# Patient Record
Sex: Male | Born: 1970 | Race: White | Hispanic: No | Marital: Married | Smoking: Never smoker
Health system: Southern US, Community
[De-identification: ages and names within clinical notes are randomized; demographics above are authoritative.]

---

## 2016-12-30 ENCOUNTER — Emergency Department (HOSPITAL_BASED_OUTPATIENT_CLINIC_OR_DEPARTMENT_OTHER): Payer: Managed Care, Other (non HMO)

## 2016-12-30 ENCOUNTER — Emergency Department (HOSPITAL_BASED_OUTPATIENT_CLINIC_OR_DEPARTMENT_OTHER)
Admission: EM | Admit: 2016-12-30 | Discharge: 2016-12-30 | Disposition: A | Discharge status: Home / Self Care | Payer: Managed Care, Other (non HMO) | Attending: Emergency Medicine | Admitting: Emergency Medicine

## 2016-12-30 ENCOUNTER — Encounter (HOSPITAL_BASED_OUTPATIENT_CLINIC_OR_DEPARTMENT_OTHER): Payer: Self-pay | Admitting: *Deleted

## 2016-12-30 DIAGNOSIS — S92531A Displaced fracture of distal phalanx of right lesser toe(s), initial encounter for closed fracture: Secondary | ICD-10-CM | POA: Diagnosis not present

## 2016-12-30 DIAGNOSIS — Y929 Unspecified place or not applicable: Secondary | ICD-10-CM | POA: Diagnosis not present

## 2016-12-30 DIAGNOSIS — Z23 Encounter for immunization: Secondary | ICD-10-CM | POA: Diagnosis not present

## 2016-12-30 DIAGNOSIS — Y9389 Activity, other specified: Secondary | ICD-10-CM | POA: Insufficient documentation

## 2016-12-30 DIAGNOSIS — W228XXA Striking against or struck by other objects, initial encounter: Secondary | ICD-10-CM | POA: Diagnosis not present

## 2016-12-30 DIAGNOSIS — S99921A Unspecified injury of right foot, initial encounter: Secondary | ICD-10-CM | POA: Diagnosis present

## 2016-12-30 DIAGNOSIS — Y999 Unspecified external cause status: Secondary | ICD-10-CM | POA: Diagnosis not present

## 2016-12-30 MED ORDER — CEPHALEXIN 500 MG PO CAPS: 500.0000 mg | ORAL_CAPSULE | Freq: Two times a day (BID) | ORAL | 0 refills | Status: AC

## 2016-12-30 MED ORDER — TETANUS-DIPHTH-ACELL PERTUSSIS 5-2.5-18.5 LF-MCG/0.5 IM SUSP
0.5000 mL | Freq: Once | INTRAMUSCULAR | Status: AC
  Administered 2016-12-30: 0.5 mL via INTRAMUSCULAR
  Filled 2016-12-30: qty 0.5

## 2016-12-30 MED ORDER — LIDOCAINE HCL (PF) 1 % IJ SOLN
5.0000 mL | Freq: Once | INTRAMUSCULAR | Status: AC
  Administered 2016-12-30: 5 mL
  Filled 2016-12-30: qty 5

## 2016-12-30 MED ORDER — CEPHALEXIN 250 MG PO CAPS
500.0000 mg | ORAL_CAPSULE | Freq: Once | ORAL | Status: AC
  Administered 2016-12-30: 500 mg via ORAL
  Filled 2016-12-30: qty 2

## 2016-12-30 NOTE — Discharge Instructions (Signed)
Read the information below.  You have a fracture of your 4th toe. Your toes are being buddy taped and you are being placed in a post-op shoe. You can take tylenol/motrin for pain relief.  You had an injury to the nail as well. The wound was irrigated, antibiotic ointment applied, and a dressing applied. Your tetanus was updated. After 24 hours you can removed bandage and wash with warm soap and water. Reapply antibiotic ointment and dressing. You are being prescribed antibiotics to prevent infection.  Ice and elevate for 20 minute increments.  It is very important that you follow up with orthopedics. I have provided the contact information above. Please call in the morning.  Use the prescribed medication as directed.  Please discuss all new medications with your pharmacist.   You may return to the Emergency Department at any time for worsening condition or any new symptoms that concern you. Return if signs of infection - redness, swelling, warmth, purulent drainage, fever.

## 2016-12-30 NOTE — ED Triage Notes (Signed)
Pt c/o right 4th toe injury x 1 hr ago

## 2016-12-30 NOTE — ED Notes (Signed)
ED Provider at bedside. 

## 2016-12-30 NOTE — ED Provider Notes (Signed)
[facility]-EMERGENCY DEPT Provider Note   CSN: [id number] Arrival date & time: 12/30/16  1614  By signing my name below, I, Linna DarnerRussell Turner, attest that this documentation has been prepared under the direction and in the presence of Arvilla MeresAshley Milessa Hogan, PA-C. Electronically Signed: Linna Darnerussell Turner, Scribe. 12/30/2016. 5:25 PM.  History   Chief Complaint Chief Complaint  Patient presents with  . Toe Injury    The history is provided by the patient. No language interpreter was used.    HPI Comments: Brian Dennis is a 46 y.o. male who presents to the Emergency Department complaining of a right toe injury that occurred a couple of hours ago. He states he was chopping firewood and not wearing shoes; he states a piece of wood struck and lacerated the 4th digit of his right foot. He notes pain to and bleeding from this toe. No medications or treatments tried PTA. He states he is not UTD for tetanus. No anticoagulants. No h/o immunocompromising conditions or chronic medical conditions. He denies numbness/tingling, neuro deficits, weakness, or any other associated symptoms.  History reviewed. No pertinent past medical history.  Patient Active Problem List   Diagnosis Date Noted  . Injury of right toe, initial encounter 01/01/2017    History reviewed. No pertinent surgical history.     Home Medications    Prior to Admission medications   Medication Sig Start Date End Date Taking? Authorizing Provider  cephALEXin (KEFLEX) 500 MG capsule Take 1 capsule (500 mg total) by mouth 2 (two) times daily. 12/30/16 01/04/17  Lona KettleAshley Laurel Lj Miyamoto, PA-C    Family History History reviewed. No pertinent family history.  Social History Social History  Substance Use Topics  . Smoking status: Never Smoker  . Smokeless tobacco: Never Used  . Alcohol use No     Allergies   Patient has no known allergies.   Review of Systems Review of Systems  Constitutional: Negative for fever.  Musculoskeletal: Positive  for arthralgias (4th digit of right foot secondary to wound).  Skin: Positive for wound (laceration to 4th digit of right foot).  Allergic/Immunologic: Negative for immunocompromised state.  Neurological: Negative for weakness and numbness.     Physical Exam Updated Vital Signs BP 139/93   Pulse 99   Temp 98.2 F (36.8 C)   Resp 20   Ht 5\' 11"  (1.803 m)   Wt 86.2 kg   SpO2 99%   BMI 26.50 kg/m   Physical Exam  Constitutional: He appears well-developed and well-nourished. No distress.  HENT:  Head: Normocephalic and atraumatic.  Eyes: Conjunctivae are normal. No scleral icterus.  Neck: Normal range of motion.  Pulmonary/Chest: Effort normal. No respiratory distress.  Musculoskeletal:  Right foot: swelling and tenderness to 4th digit. Loss of eponychium noted. Displaced nail at proximal end. Bruising noted under proximal end of nail. Superficial laceration. Capillary refill intact. Sensation intact. Able to move toes.   Neurological: He is alert. He is not disoriented. No sensory deficit. GCS eye subscore is 4. GCS verbal subscore is 5. GCS motor subscore is 6.  Skin: Skin is warm and dry. He is not diaphoretic.  Psychiatric: He has a normal mood and affect. His behavior is normal.     ED Treatments / Results  Labs (all labs ordered are listed, but only abnormal results are displayed) Labs Reviewed - No data to display  EKG  EKG Interpretation None       Radiology No results found.  Procedures Procedures (including critical care time) NERVE BLOCK Performed  by: Lona Kettle Consent: Verbal consent obtained. Required items: required blood products, implants, devices, and special equipment available  Indication: procedure Nerve block body site: right foot 4th digit  Preparation: Patient was prepped and draped in the usual sterile fashion. Needle gauge: 24 G Location technique: anatomical landmarks  Local anesthetic: 1% lidocaine w/o  epinephrine  Anesthetic total: 4 ml  Outcome: pain improved Patient tolerance: Patient tolerated the procedure well with no immediate complications.  DIAGNOSTIC STUDIES: Oxygen Saturation is 99% on RA, normal by my interpretation.    COORDINATION OF CARE: 5:31 PM Discussed treatment plan with pt at bedside and pt agreed to plan.  Medications Ordered in ED Medications  lidocaine (PF) (XYLOCAINE) 1 % injection 5 mL (5 mLs Infiltration Given 12/30/16 1744)  Tdap (BOOSTRIX) injection 0.5 mL (0.5 mLs Intramuscular Given 12/30/16 1744)  cephALEXin (KEFLEX) capsule 500 mg (500 mg Oral Given 12/30/16 2010)     Initial Impression / Assessment and Plan / ED Course  I have reviewed the triage vital signs and the nursing notes.  Pertinent labs & imaging results that were available during my care of the patient were reviewed by me and considered in my medical decision making (see chart for details).  Clinical Course as of Jan 02 741  Mon Dec 30, 2016  1749 DG Foot Complete Right [AM]    Clinical Course User Index [AM] Lona Kettle, PA-C    Patient presents to ED with complaint of right 4th digit pain s/p injury PTA. Pt dropped piece of wood on bare foot. No anti-coagulation therapy. No immunocompromising conditions. Patient is afebrile and non-toxic appearing in NAD. VSS. Swelling and tenderness to right 4th digit noted; eponychium absent, proximal nail displaced, bruising noted under nail - likely nailbed injury. Neurovascularly intact. X-ray remarkable for comminuted fracture of 4th distal phalanx. Tetanus updated. Digital block performed by me. Wound copiously irrigated and explored by me. Unable to suture nail to eponychium given loss of eponychium. Lacerations are superficial and not amenable to closure. Discussed pt with Dr. Verdie Mosher, who also evaluated patient.   Discussed results and plan with pt. Dr. Verdie Mosher discussed with pt probable loss of nail. Will not remove nail so as to provide  protection to underlying nailbed. ABX ointment and bandage applied. Toes buddy taped and post-op shoe applied. Given probable injury to nail bed will initiate ABX. Encouraged follow up with orthopedics for further evaluation and management. Symptomatic management discussed. Referral to ortho provided. Return precautions given. Pt voiced understanding and is agreeable.   Final Clinical Impressions(s) / ED Diagnoses   Final diagnoses:  Displaced fracture of distal phalanx of right lesser toe(s), initial encounter for closed fracture    New Prescriptions Discharge Medication List as of 12/30/2016  8:15 PM    START taking these medications   Details  cephALEXin (KEFLEX) 500 MG capsule Take 1 capsule (500 mg total) by mouth 2 (two) times daily., Starting Mon 12/30/2016, Until Sat 01/04/2017, Print       I personally performed the services described in this documentation, which was scribed in my presence. The recorded information has been reviewed and is accurate.    Lona Kettle, [place] 01/02/17 1610    Lavera Guise, MD 01/02/17 [phone]

## 2016-12-31 NOTE — ED Provider Notes (Signed)
Medical screening examination/treatment/procedure(s) were conducted as a shared visit with non-physician practitioner(s) and myself.  I personally evaluated the patient during the encounter.   EKG Interpretation None      46 year old male who presents with toe injury. Chopping firewood, and large piece of wood fell on right foot, injuring tip of 4th toe. Xr visualized, and he has fracture of distal 4th phalanx. On exam, there is destruction of the eponychium and nail displaced from nailbed at the proximal end. Bruising and nail bed injury underneath. Unable to suture nail into eponychium. Discussed likely permanent loss of nail with patient. Buddy taped toes, antibiotics, and follow-up with orthopedic surgery.   Lavera Guiseana Duo Liu, MD 12/31/16 1346

## 2017-01-01 ENCOUNTER — Ambulatory Visit (INDEPENDENT_AMBULATORY_CARE_PROVIDER_SITE_OTHER): Payer: Managed Care, Other (non HMO) | Admitting: Family Medicine

## 2017-01-01 ENCOUNTER — Encounter: Payer: Self-pay | Admitting: Family Medicine

## 2017-01-01 DIAGNOSIS — S99921A Unspecified injury of right foot, initial encounter: Secondary | ICD-10-CM

## 2017-01-01 NOTE — Progress Notes (Signed)
PCP: No PCP Per Patient  Subjective:   HPI: Patient is a 46 y.o. male here for right toe injury.  Patient reports on 1/1 he was chopping firewood when a piece of wood came off and lodged into his distal right 4th toe. Not much pain when this happened but a lot of bleeding with swelling. Pain level is 1/10 currently, dull. Went to ED - radiographs showed a comminuted distal phalanx fracture. Laceration with a flap component but were unable to suture this. Still having problems with this bleeding - changing dressing daily. No skin changes otherwise. Tolerating antibiotic (keflex) without side effects. No rash.  No past medical history on file.  Current Outpatient Prescriptions on File Prior to Visit  Medication Sig Dispense Refill  . cephALEXin (KEFLEX) 500 MG capsule Take 1 capsule (500 mg total) by mouth 2 (two) times daily. 10 capsule 0   No current facility-administered medications on file prior to visit.     No past surgical history on file.  No Known Allergies  Social History   Social History  . Marital status: Married    Spouse name: N/A  . Number of children: N/A  . Years of education: N/A   Occupational History  . Not on file.   Social History Main Topics  . Smoking status: Never Smoker  . Smokeless tobacco: Never Used  . Alcohol use No  . Drug use: No  . Sexual activity: Not on file   Other Topics Concern  . Not on file   Social History Narrative  . No narrative on file    No family history on file.  BP 129/90   Pulse 97   Ht 5\' 11"  (1.803 m)   Wt 190 lb (86.2 kg)   BMI 26.50 kg/m   Review of Systems: See HPI above.     Objective:  Physical Exam:  Gen: NAD, comfortable in exam room  Right foot: Dorsal laceration with flap component - oozing blood base of nail with nail base visible.  No redness, purulence, other deformity.  No malrotation or angulation. TTP throughout 4th digit. Able to flex and extend digits slightly. NVI  distally.   Assessment & Plan:  1. Right 4th digit distal phalanx fracture - comminuted but this should heal well over 6 weeks.  Continue wound care - antibiotic ointment with nonstick gauze and change dressing at least daily.  Continue keflex as well.  Reassured patient.  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  Ibuprofen, tylenol if needed.  F/u in 2 weeks.

## 2017-01-01 NOTE — Assessment & Plan Note (Signed)
Right 4th digit distal phalanx fracture - comminuted but this should heal well over 6 weeks.  Continue wound care - antibiotic ointment with nonstick gauze and change dressing at least daily.  Continue keflex as well.  Reassured patient.  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  Ibuprofen, tylenol if needed.  F/u in 2 weeks.

## 2017-01-01 NOTE — Patient Instructions (Addendum)
You have a distal phalanx fracture along with your laceration. Change the dressing daily (or more frequently if this soaks through). Antibiotic ointment with non-stick gauze first layer then regular gauze second layer, tape over the top. Continue the oral keflex as well until you complete all of these. Activities as tolerated. See work note for details - call me if we need to make changes. Tylenol and/or ibuprofen as needed for pain. Follow up with me in 2 weeks.

## 2018-03-07 IMAGING — CR DG FOOT COMPLETE 3+V*R*
3 series · 3 of 3 positions shown · non-contrast
Comparison: None.

CLINICAL DATA: Dropped Davido on distal phalanx of the fourth digit.

EXAM:
RIGHT FOOT COMPLETE - 3+ VIEW

[t foot ap right]
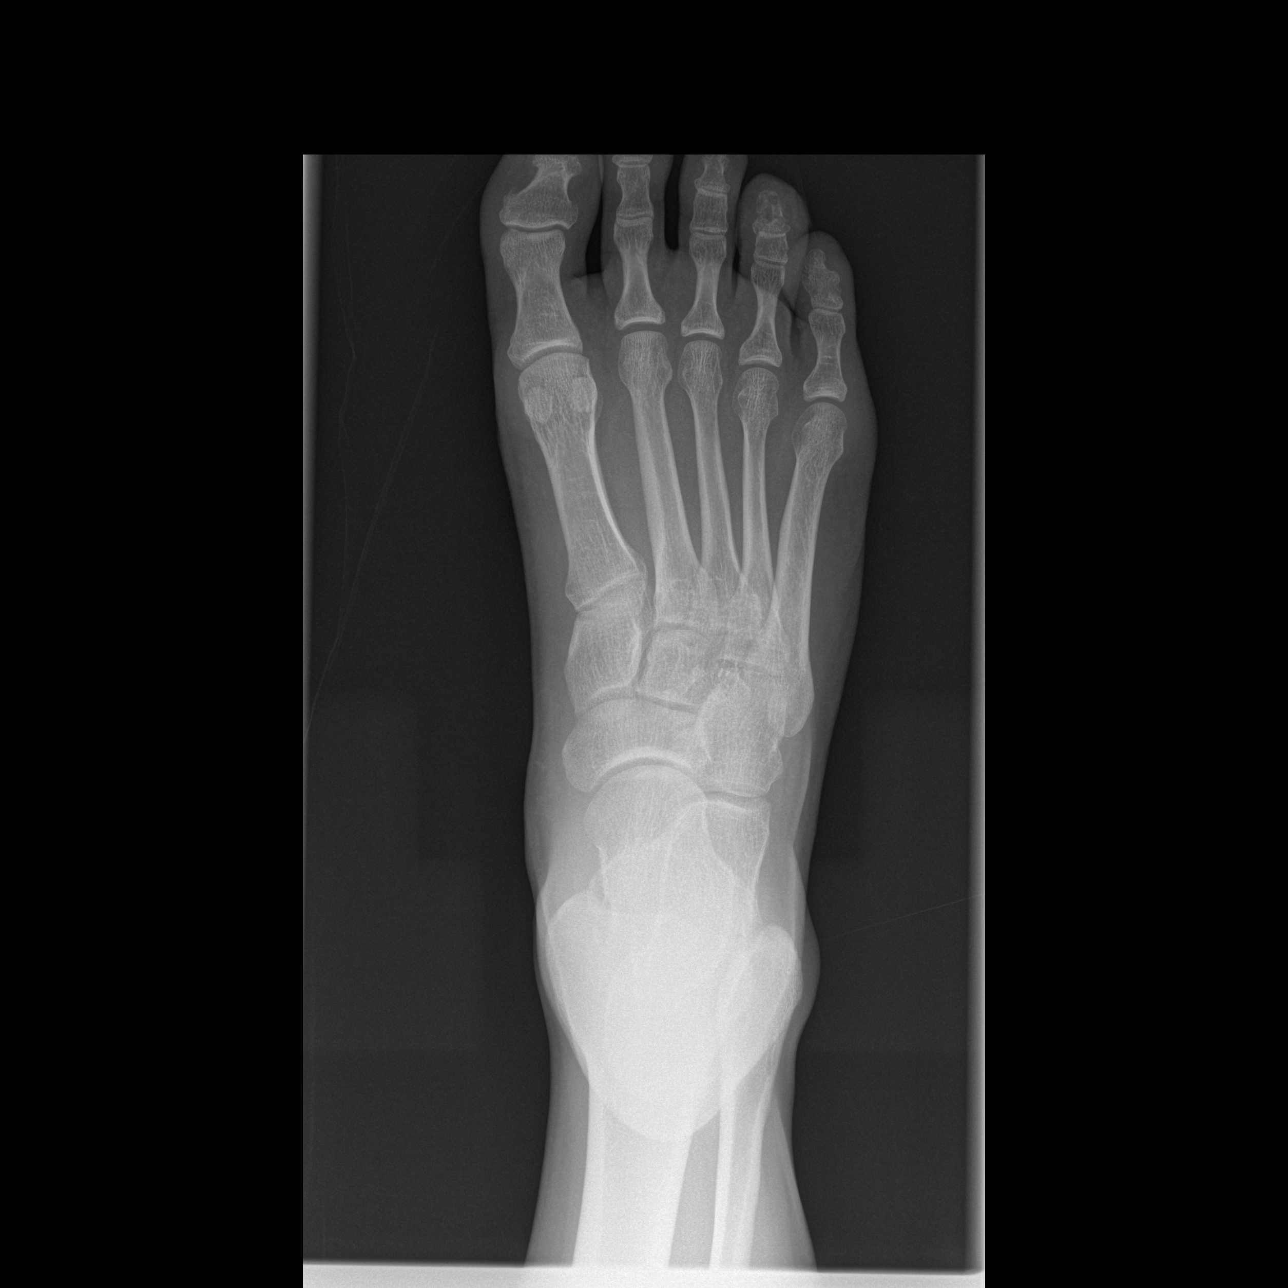

[t foot oblique right]
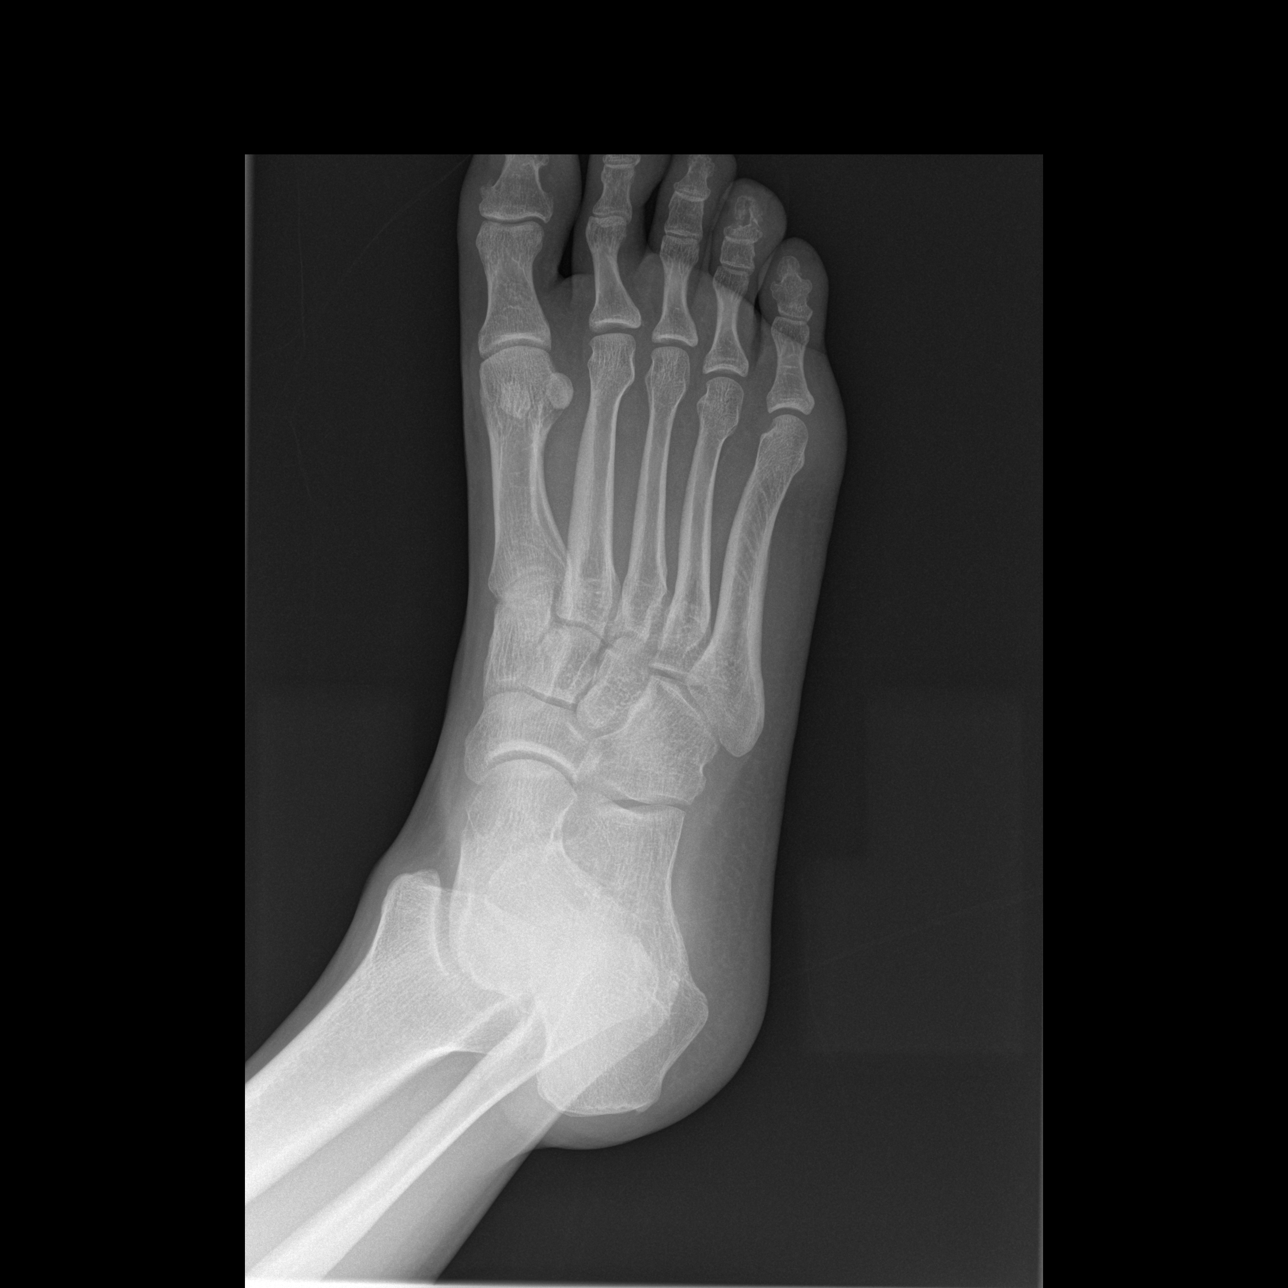

[t foot lat right]
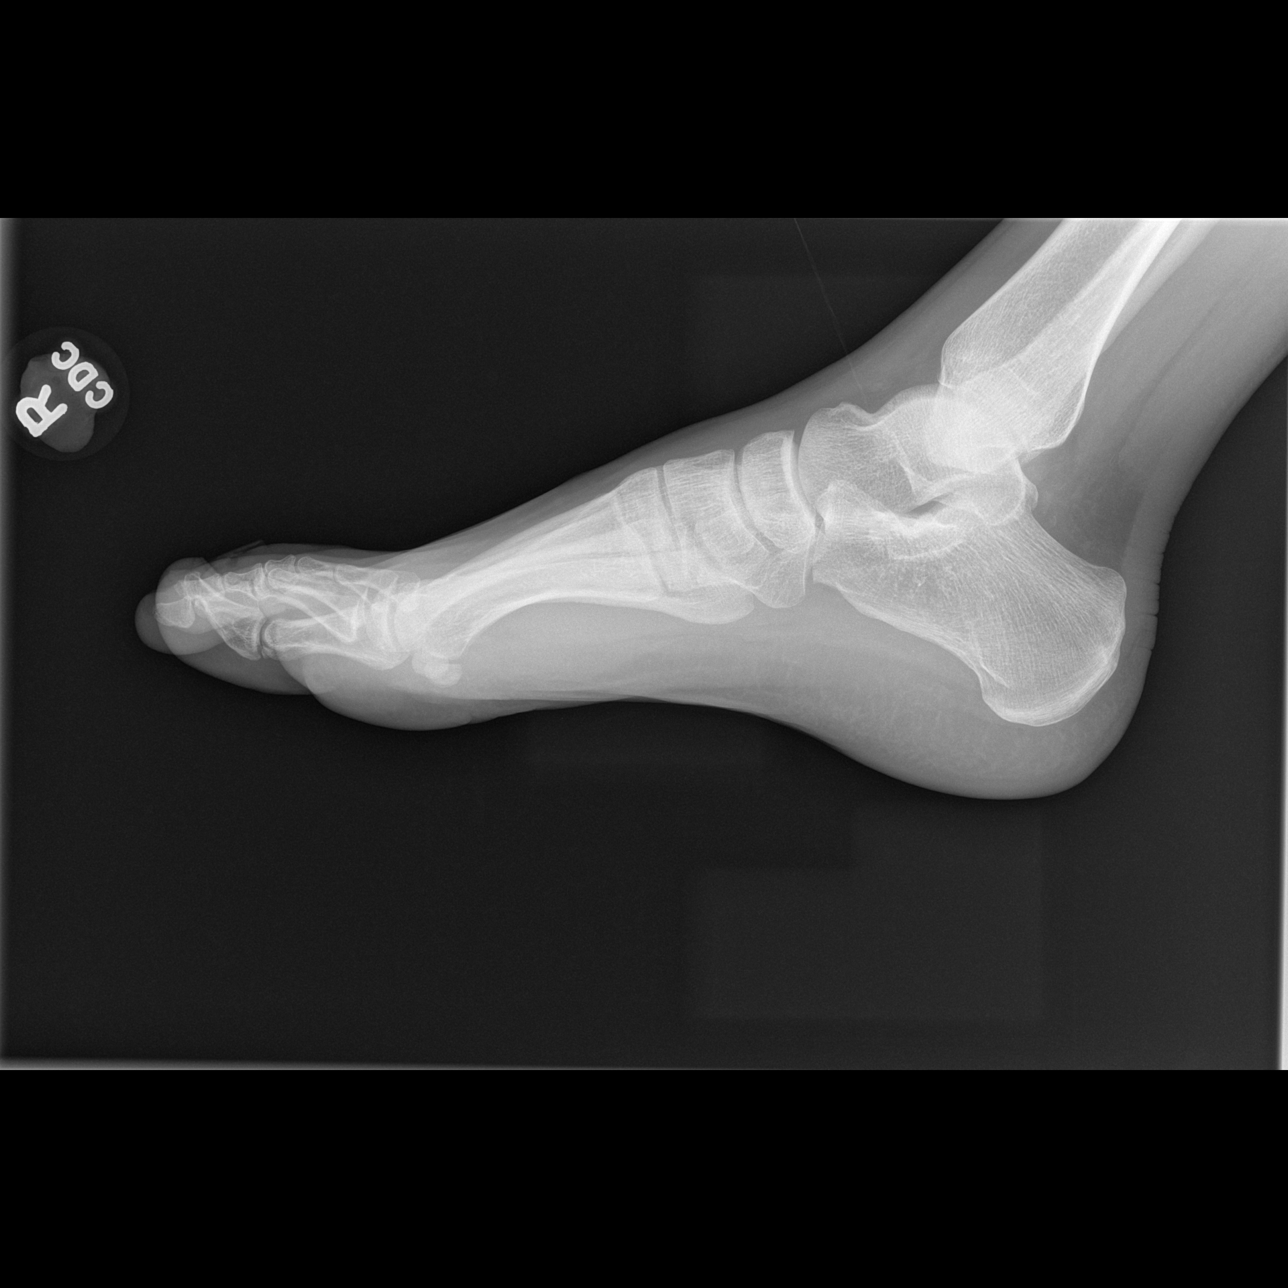

[3 of 3 positions shown; findings below may reference images not displayed]

FINDINGS: There is comminuted displaced fracture of the distal fourth phalanx.
There is no dislocation. Associated soft tissue swelling is
identified.
IMPRESSION: Comminuted displaced fracture of the distal fourth phalanx.
